# Patient Record
Sex: Male | Born: 1985 | Race: White | Hispanic: No | Marital: Married | Smoking: Never smoker
Health system: Southern US, Community
[De-identification: ages and names within clinical notes are randomized; demographics above are authoritative.]

## PROBLEM LIST (undated history)

## (undated) DIAGNOSIS — B019 Varicella without complication: Secondary | ICD-10-CM

## (undated) HISTORY — DX: Varicella without complication: B01.9

---

## 2016-10-27 ENCOUNTER — Encounter: Payer: Self-pay | Admitting: Physician Assistant

## 2016-10-27 ENCOUNTER — Ambulatory Visit (INDEPENDENT_AMBULATORY_CARE_PROVIDER_SITE_OTHER): Payer: BLUE CROSS/BLUE SHIELD | Admitting: Physician Assistant

## 2016-10-27 VITALS — BP 138/80 | HR 76 | Temp 98.6°F | Resp 16 | Ht 70.0 in | Wt 212.0 lb

## 2016-10-27 DIAGNOSIS — Z0000 Encounter for general adult medical examination without abnormal findings: Secondary | ICD-10-CM | POA: Diagnosis not present

## 2016-10-27 DIAGNOSIS — H60502 Unspecified acute noninfective otitis externa, left ear: Secondary | ICD-10-CM | POA: Diagnosis not present

## 2016-10-27 MED ORDER — CIPROFLOXACIN-DEXAMETHASONE 0.3-0.1 % OT SUSP: 4.0000 [drp] | Freq: Two times a day (BID) | OTIC | 0 refills | Status: AC

## 2016-10-27 NOTE — Patient Instructions (Signed)
Otitis Externa Otitis externa is an infection of the outer ear canal. The outer ear canal is the area between the outside of the ear and the eardrum. Otitis externa is sometimes called "swimmer's ear." Follow these instructions at home:  If you were given antibiotic ear drops, use them as told by your doctor. Do not stop using them even if your condition gets better.  Take over-the-counter and prescription medicines only as told by your doctor.  Keep all follow-up visits as told by your doctor. This is important. How is this prevented?  Keep your ear dry. Use the corner of a towel to dry your ear after you swim or bathe.  Try not to scratch or put things in your ear. Doing these things makes it easier for germs to grow in your ear.  Avoid swimming in lakes, dirty water, or pools that may not have the right amount of a chemical called chlorine.  Consider making ear drops and putting 3 or 4 drops in each ear after you swim. Ask your doctor about how you can make ear drops. Contact a doctor if:  You have a fever.  After 3 days your ear is still red, swollen, or painful.  After 3 days you still have pus coming from your ear.  Your redness, swelling, or pain gets worse.  You have a really bad headache.  You have redness, swelling, pain, or tenderness behind your ear. This information is not intended to replace advice given to you by your health care provider. Make sure you discuss any questions you have with your health care provider. Document Released: 11/25/2007 Document Revised: 07/04/2015 Document Reviewed: 03/18/2015 Elsevier Interactive Patient Education  2017 Elsevier Inc.  

## 2016-10-27 NOTE — Progress Notes (Signed)
Patient: Javier Ferguson Male    DOB: Nov 13, 1985   31 y.o.   MRN: [id number] Visit Date: 10/27/2016  Today's Provider: Trey SailorsAdriana M Pollak, PA-C   Chief Complaint  Patient presents with  . Establish Care  . Ear Pain    Started yesterday   Subjective:    Otalgia   There is pain in the left ear. This is a new problem. The current episode started yesterday. There has been no fever. The pain is at a severity of 0/10 (Pt says his ear feels "clogged" but when he touches it the pain level is 6/10.). Associated symptoms include ear discharge. Pertinent negatives include no headaches, neck pain, rhinorrhea or sore throat.   Javier Ferguson is a 31 y/o male with no contributory PMH presenting today with left sided ear pain ongoing for one pain. There is pain in the canal, some discharge in the morning. There is a muffled sensation in his ear. It hurts to the touch, hurst when he lies down on a pillow. He has been using the blunt end of tweezers to clean his ears out.     No Known Allergies   Current Outpatient Prescriptions:  .  ciprofloxacin-dexamethasone (CIPRODEX) otic suspension, Place 4 drops into the left ear 2 (two) times daily., Disp: 7.5 mL, Rfl: 0  Review of Systems  Constitutional: Positive for fatigue. Negative for activity change, appetite change, chills, diaphoresis, fever and unexpected weight change.  HENT: Positive for ear discharge and ear pain. Negative for congestion, nosebleeds, postnasal drip, rhinorrhea, sinus pain, sinus pressure, sneezing, sore throat, tinnitus, trouble swallowing and voice change.   Eyes: Negative.   Respiratory: Negative.   Gastrointestinal: Negative.   Musculoskeletal: Positive for neck stiffness. Negative for neck pain.  Neurological: Positive for light-headedness. Negative for dizziness and headaches.    Social History  Substance Use Topics  . Smoking status: Never Smoker  . Smokeless tobacco: Never Used  . Alcohol use No   Family  History  Problem Relation Age of Onset  . Healthy Mother   . Healthy Father   . Healthy Sister   . Lung cancer Paternal Grandfather    History reviewed. No pertinent past medical history.   Objective:   BP 138/80 (BP Location: Left Arm, Patient Position: Sitting, Cuff Size: Normal)   Pulse 76   Temp 98.6 F (37 C)   Resp 16   Ht 5\' 10"  (1.778 m)   Wt 212 lb (96.2 kg)   BMI 30.42 kg/m  Vitals:   10/27/16 1608  BP: 138/80  Pulse: 76  Resp: 16  Temp: 98.6 F (37 C)  Weight: 212 lb (96.2 kg)  Height: 5\' 10"  (1.778 m)     Physical Exam  Constitutional: He appears well-developed and well-nourished. No distress.  HENT:  Head: Normocephalic and atraumatic.  Right Ear: Tympanic membrane and external ear normal.  Left Ear: Tympanic membrane normal. There is swelling and tenderness. Tympanic membrane is not injected, not perforated, not erythematous and not bulging.  Mouth/Throat: Oropharynx is clear and moist. No oropharyngeal exudate.  There is minimal left tragal tenderness, some tenderness to scope insertion in left ear canal. Canal is erythematous and swollen but TM is normal.         Assessment & Plan:     1. Encounter for medical examination to establish care  Should follow up in a few months for physical.  2. Acute otitis externa of left ear, unspecified type  Treat as  below, call back if not improving. Please don't use tweezers to clean out ears. Use debrox to soften wax and allow to fall out naturally. Should follow up for physical in a few months.  - ciprofloxacin-dexamethasone (CIPRODEX) otic suspension; Place 4 drops into the left ear 2 (two) times daily.  Dispense: 7.5 mL; Refill: 0  Return if symptoms worsen or fail to improve, for annual physical.  The entirety of the information documented in the History of Present Illness, Review of Systems and Physical Exam were personally obtained by me. Portions of this information were initially documented by Kavin Leech, CMA and reviewed by me for thoroughness and accuracy.        Trey Sailors, PA-C  [facility]

## 2017-08-13 DIAGNOSIS — H5201 Hypermetropia, right eye: Secondary | ICD-10-CM | POA: Diagnosis not present

## 2017-08-13 DIAGNOSIS — H52223 Regular astigmatism, bilateral: Secondary | ICD-10-CM | POA: Diagnosis not present

## 2017-08-14 ENCOUNTER — Emergency Department: Payer: BLUE CROSS/BLUE SHIELD

## 2017-08-14 ENCOUNTER — Emergency Department
Admission: EM | Admit: 2017-08-14 | Discharge: 2017-08-15 | Disposition: A | Discharge status: Home / Self Care | Payer: BLUE CROSS/BLUE SHIELD | Attending: Emergency Medicine | Admitting: Emergency Medicine

## 2017-08-14 ENCOUNTER — Other Ambulatory Visit: Payer: Self-pay

## 2017-08-14 DIAGNOSIS — R509 Fever, unspecified: Secondary | ICD-10-CM | POA: Diagnosis not present

## 2017-08-14 DIAGNOSIS — R42 Dizziness and giddiness: Secondary | ICD-10-CM | POA: Diagnosis not present

## 2017-08-14 DIAGNOSIS — R1011 Right upper quadrant pain: Secondary | ICD-10-CM | POA: Insufficient documentation

## 2017-08-14 DIAGNOSIS — R55 Syncope and collapse: Secondary | ICD-10-CM | POA: Diagnosis not present

## 2017-08-14 DIAGNOSIS — E860 Dehydration: Secondary | ICD-10-CM | POA: Diagnosis not present

## 2017-08-14 DIAGNOSIS — R1110 Vomiting, unspecified: Secondary | ICD-10-CM | POA: Diagnosis not present

## 2017-08-14 DIAGNOSIS — R51 Headache: Secondary | ICD-10-CM | POA: Insufficient documentation

## 2017-08-14 DIAGNOSIS — B349 Viral infection, unspecified: Secondary | ICD-10-CM | POA: Insufficient documentation

## 2017-08-14 LAB — BASIC METABOLIC PANEL
Anion gap: 9 (ref 5–15)
BUN: 13 mg/dL (ref 6–20)
CALCIUM: 9.1 mg/dL (ref 8.9–10.3)
CHLORIDE: 103 mmol/L (ref 101–111)
CO2: 26 mmol/L (ref 22–32)
Creatinine, Ser: 1.01 mg/dL (ref 0.61–1.24)
GFR calc non Af Amer: >60 mL/min (ref >60)
Glucose, Bld: 126 mg/dL — ABNORMAL HIGH (ref 65–99)
Potassium: 3.8 mmol/L (ref 3.5–5.1)
SODIUM: 138 mmol/L (ref 135–145)

## 2017-08-14 LAB — CBC
HCT: 46.9 % (ref 40.0–52.0)
Hemoglobin: 15.6 g/dL (ref 13.0–18.0)
MCH: 27.7 pg (ref 26.0–34.0)
MCHC: 33.2 g/dL (ref 32.0–36.0)
MCV: 83.3 fL (ref 80.0–100.0)
Platelets: 184 10*3/uL (ref 150–440)
RBC: 5.63 MIL/uL (ref 4.40–5.90)
RDW: 14 % (ref 11.5–14.5)
WBC: 9.9 10*3/uL (ref 3.8–10.6)

## 2017-08-14 MED ORDER — SODIUM CHLORIDE 0.9 % IV SOLN
Freq: Once | INTRAVENOUS | Status: AC
  Administered 2017-08-14: via INTRAVENOUS

## 2017-08-14 NOTE — ED Triage Notes (Signed)
Patient states he was in bathroom urinating and felt "faint" next thing he knew he was waking up.  Reports sore throat and tired feeling for most of the day.

## 2017-08-15 ENCOUNTER — Encounter: Payer: Self-pay | Admitting: Emergency Medicine

## 2017-08-15 DIAGNOSIS — R55 Syncope and collapse: Secondary | ICD-10-CM | POA: Diagnosis not present

## 2017-08-15 LAB — MONONUCLEOSIS SCREEN: Mono Screen: NEGATIVE

## 2017-08-15 LAB — INFLUENZA PANEL BY PCR (TYPE A & B)
Influenza A By PCR: NEGATIVE
Influenza B By PCR: NEGATIVE

## 2017-08-15 LAB — URINALYSIS, COMPLETE (UACMP) WITH MICROSCOPIC
Bacteria, UA: NONE SEEN
Bilirubin Urine: NEGATIVE
GLUCOSE, UA: NEGATIVE mg/dL
Hgb urine dipstick: NEGATIVE
Ketones, ur: NEGATIVE mg/dL
Leukocytes, UA: NEGATIVE
Nitrite: NEGATIVE
PH: 8 (ref 5.0–8.0)
Protein, ur: NEGATIVE mg/dL
RBC / HPF: NONE SEEN RBC/hpf (ref 0–5)
SPECIFIC GRAVITY, URINE: 1.02 (ref 1.005–1.030)

## 2017-08-15 LAB — TROPONIN I

## 2017-08-15 LAB — FIBRIN DERIVATIVES D-DIMER (ARMC ONLY): Fibrin derivatives D-dimer (ARMC): 186.36 ng/mL (FEU) (ref 0.00–499.00)

## 2017-08-15 LAB — GROUP A STREP BY PCR: Group A Strep by PCR: NOT DETECTED

## 2017-08-15 MED ORDER — IBUPROFEN 600 MG PO TABS
ORAL_TABLET | ORAL | Status: AC
  Filled 2017-08-15: qty 1

## 2017-08-15 MED ORDER — SODIUM CHLORIDE 0.9 % IV BOLUS (SEPSIS)
1000.0000 mL | Freq: Once | INTRAVENOUS | Status: AC
  Administered 2017-08-15: 1000 mL via INTRAVENOUS

## 2017-08-15 MED ORDER — IBUPROFEN 100 MG/5ML PO SUSP
ORAL | Status: AC
  Filled 2017-08-15: qty 30

## 2017-08-15 MED ORDER — IBUPROFEN 100 MG/5ML PO SUSP
600.0000 mg | Freq: Once | ORAL | Status: AC
  Administered 2017-08-15: 600 mg via ORAL

## 2017-08-15 NOTE — ED Provider Notes (Signed)
[facility] Emergency Department Provider Note   ____________________________________________   First MD Initiated Contact with Patient 08/14/17 2334     (approximate)  I have reviewed the triage vital signs and the nursing notes.   HISTORY  Chief Complaint Loss of Consciousness    HPI Javier Ferguson is a 32 y.o. male who comes into the hospital today with a syncopal episode.  The patient states that he was urinating and felt like he was going to faint.  He reports that he was being woken up by his significant other.  The significant other heard a loud thud.  She states that she found him on the floor.  His eyes were open and he was jerking.  She states that he woke up he was pale clammy and sweaty.  She reports he also had some episodes of vomiting.  The patient has had a sore throat all day and some blurred vision for a few months but he was seen by the eye doctor and was told that he needed some glasses.  Because the patient was not feeling well today he had not really eaten or drank much.  He felt similar when he was in triage here and had another syncopal episode.  EMS was called to the house and the patient's temperature was 100.2.  He states that a coworker has been sick.  He has had some dizziness as well as some mild headache he did have some discomfort in his right upper quadrant that is improved.  The patient significant other also had some flulike symptoms a few weeks ago and was treated with Tamiflu.  He was brought in for evaluation of his symptoms.  Past Medical History:  Diagnosis Date  . Chickenpox     There are no active problems to display for this patient.   History reviewed. No pertinent surgical history.  Prior to Admission medications   Not on File    Allergies Patient has no known allergies.  Family History  Problem Relation Age of Onset  . Healthy Mother   . Healthy Father   . Healthy Sister   . Lung cancer Paternal  Grandfather     Social History Social History   Tobacco Use  . Smoking status: Never Smoker  . Smokeless tobacco: Never Used  Substance Use Topics  . Alcohol use: No  . Drug use: No    Review of Systems  Constitutional: No fever/chills Eyes: No visual changes. ENT:  sore throat. Cardiovascular: Denies chest pain. Respiratory: Denies shortness of breath. Gastrointestinal: abdominal pain.  nausea, vomiting.  No diarrhea.  No constipation. Genitourinary: Negative for dysuria. Musculoskeletal: Negative for back pain. Skin: Negative for rash. Neurological: syncope   ____________________________________________   PHYSICAL EXAM:  VITAL SIGNS: ED Triage Vitals  Enc Vitals Group     BP 08/14/17 2248 131/71     Pulse Rate 08/14/17 2248 (!) 102     Resp 08/14/17 2248 18     Temp 08/14/17 2248 99 F (37.2 C)     Temp Source 08/14/17 2248 Oral     SpO2 08/14/17 2248 99 %     Weight 08/14/17 2247 215 lb (97.5 kg)     Height 08/14/17 2247 5\' 10"  (1.778 m)     Head Circumference --      Peak Flow --      Pain Score 08/14/17 2246 2     Pain Loc --      Pain Edu? --  Excl. in GC? --     Constitutional: Alert and oriented. Well appearing and in mild distress. Eyes: Conjunctivae are normal. PERRL. EOMI. Head: Atraumatic. Nose: No congestion/rhinnorhea. Mouth/Throat: Mucous membranes are moist.  Oropharynx non-erythematous. Cardiovascular: Normal rate, regular rhythm. Grossly normal heart sounds.  Good peripheral circulation. Respiratory: Normal respiratory effort.  No retractions. Lungs CTAB. Gastrointestinal: Soft and nontender. No distention. Positive bowel sounds Musculoskeletal: No lower extremity tenderness nor edema.   Neurologic:  Normal speech and language.  Cranial nerves II through XII are grossly intact with no focal motor neuro deficit Skin:  Skin is warm, dry and intact.  Psychiatric: Mood and affect are normal.    ____________________________________________   LABS (all labs ordered are listed, but only abnormal results are displayed)  Labs Reviewed  BASIC METABOLIC PANEL - Abnormal; Notable for the following components:      Result Value   Glucose, Bld 126 (*)    All other components within normal limits  URINALYSIS, COMPLETE (UACMP) WITH MICROSCOPIC - Abnormal; Notable for the following components:   Color, Urine YELLOW (*)    APPearance CLEAR (*)    Squamous Epithelial / LPF 0-5 (*)    All other components within normal limits  GROUP A STREP BY PCR  CBC  TROPONIN I  INFLUENZA PANEL BY PCR (TYPE A & B)  MONONUCLEOSIS SCREEN  FIBRIN DERIVATIVES D-DIMER ([facility] ONLY)   ____________________________________________  EKG  ED ECG REPORT I, Rebecka ApleyWebster,  Jeorgia Helming P, the attending physician, personally viewed and interpreted this ECG.   Date: 08/14/2017  EKG Time: 2247  Rate: 111  Rhythm: normal sinus rhythm, sinus tachycardia  Axis: normal  Intervals:none  ST&T Change:   ____________________________________________  RADIOLOGY  ED MD interpretation:  CT head: No acute intracranial abnormality  Official radiology report(s): Ct Head Wo Contrast  Result Date: 08/15/2017 CLINICAL DATA:  Syncopal episode in bathroom. Sore throat and malaise today. EXAM: CT HEAD WITHOUT CONTRAST TECHNIQUE: Contiguous axial images were obtained from the base of the skull through the vertex without intravenous contrast. COMPARISON:  None. FINDINGS: BRAIN: No intraparenchymal hemorrhage, mass effect nor midline shift. The ventricles and sulci are normal. No acute large vascular territory infarcts. No abnormal extra-axial fluid collections. Basal cisterns are patent. VASCULAR: Unremarkable. SKULL/SOFT TISSUES: No skull fracture. No significant soft tissue swelling. ORBITS/SINUSES: The included ocular globes and orbital contents are normal.The mastoid aircells and included paranasal sinuses are well-aerated. OTHER:  None. IMPRESSION: Normal noncontrast CT HEAD. Electronically Signed   By: Awilda Metroourtnay  Bloomer M.D.   On: 08/15/2017 00:09    ____________________________________________   PROCEDURES  Procedure(s) performed: None  Procedures  Critical Care performed: No  ____________________________________________   INITIAL IMPRESSION / ASSESSMENT AND PLAN / ED COURSE  As part of my medical decision making, I reviewed the following data within the electronic MEDICAL RECORD NUMBER Notes from prior ED visits and [place] Controlled Substance Database   This is a 32 year old male who comes into the hospital today with a syncopal episode.  The patient has not felt well today and states that he was urinating when this occurred.  My differential diagnosis includes micturition syncope, influenza, mononucleosis, strep, dehydration.  I did check some blood work on the patient.  The patient CBC BMP and troponin were unremarkable.  His flu and his mono were negative.  I did look at the patient's EKG and he does have S in his first lead as well as a Q wave in his third lead with some flattening of  his T wave.  The patient is little tachycardic so I will add a d-dimer onto his blood work and give him a second liter of normal saline.  The patient will be reassessed once I received his results.  The strep and the urinalysis was still pending at this time.     The strep is negative and is the mono, the patient's remaining blood work is all unremarkable.  He also had a d-dimer which was negative.  The patient was tachycardic after his first liter so I gave him a second and then subsequently a third.  The patient also received a dose of ibuprofen for headache and he was given some food.  Since the patient's heart rate is improved he will be discharged home to follow-up. ____________________________________________   FINAL CLINICAL IMPRESSION(S) / ED DIAGNOSES  Final diagnoses:  Syncope, unspecified syncope type  Dehydration   Viral illness     ED Discharge Orders    None       Note:  This document was prepared using Dragon voice recognition software and may include unintentional dictation errors.    Rebecka Apley, MD 08/15/17 0530

## 2017-08-15 NOTE — Discharge Instructions (Signed)
Please follow up with your primary care physician for further evaluation of your symptoms. Please ensure that you are eating and drinking well. Please return with any other concerns

## 2017-08-16 ENCOUNTER — Encounter: Payer: Self-pay | Admitting: Physician Assistant

## 2017-08-16 ENCOUNTER — Ambulatory Visit: Payer: BLUE CROSS/BLUE SHIELD | Admitting: Physician Assistant

## 2017-08-16 VITALS — BP 138/82 | HR 98 | Temp 98.8°F | Resp 16 | Wt 218.4 lb

## 2017-08-16 DIAGNOSIS — R7301 Impaired fasting glucose: Secondary | ICD-10-CM

## 2017-08-16 DIAGNOSIS — R55 Syncope and collapse: Secondary | ICD-10-CM

## 2017-08-16 DIAGNOSIS — E860 Dehydration: Secondary | ICD-10-CM

## 2017-08-16 LAB — POCT GLYCOSYLATED HEMOGLOBIN (HGB A1C)
Est. average glucose Bld gHb Est-mCnc: 105
Hemoglobin A1C: 5.3

## 2017-08-16 NOTE — Progress Notes (Signed)
Patient: Javier Ferguson Male    DOB: 03-Apr-1986   32 y.o.   MRN: [id number] Visit Date: 08/16/2017  Today's Provider: Margaretann LovelessJennifer M Burnette, PA-C   Chief Complaint  Patient presents with  . Follow-up   Subjective:    HPI   Follow Up ER Visit  Patient is here for ER follow up.  [facility] Syncope on 08/14/17. Treatment for this included Fluids.The patient CBC, BMP and troponin were unremarkable.  His flu, strep and his mono were negative. Patient's EKG -Sinus tachycardia He reports excellent compliance with treatment. He also had a d-dimer which was negative. He reports this condition is Improved. Reports that feels tired. He still has a sore throat. Family history of Hypertension, Diabetes Mellitus and Alcoholism. ------------------------------------------------------------------------------------    No Known Allergies  No current outpatient medications on file.  Review of Systems  Constitutional: Positive for fatigue.  HENT: Positive for congestion, postnasal drip and sore throat (improving). Negative for ear pain, nosebleeds, rhinorrhea, sinus pressure, sinus pain, sneezing, tinnitus and trouble swallowing.   Respiratory: Negative for cough, chest tightness, shortness of breath and wheezing.   Cardiovascular: Negative for chest pain, palpitations and leg swelling.  Gastrointestinal: Negative for abdominal pain.  Neurological: Negative for dizziness, syncope (none since Saturday incidents), weakness, light-headedness and headaches.    Social History   Tobacco Use  . Smoking status: Never Smoker  . Smokeless tobacco: Never Used  Substance Use Topics  . Alcohol use: No   Objective:   BP 138/82 (BP Location: Left Arm, Patient Position: Sitting, Cuff Size: Normal)   Pulse 98   Temp 98.8 F (37.1 C) (Oral)   Resp 16   Wt 218 lb 6.4 oz (99.1 kg)   SpO2 97%   BMI 31.34 kg/m    Physical Exam  Constitutional: He appears well-developed  and well-nourished. No distress.  HENT:  Head: Normocephalic and atraumatic.  Right Ear: Hearing, external ear and ear canal normal. Tympanic membrane is not erythematous and not bulging. A middle ear effusion is present.  Left Ear: Hearing, tympanic membrane, external ear and ear canal normal. Tympanic membrane is not erythematous and not bulging.  No middle ear effusion.  Nose: Nose normal. No mucosal edema or rhinorrhea. Right sinus exhibits no maxillary sinus tenderness and no frontal sinus tenderness. Left sinus exhibits no maxillary sinus tenderness and no frontal sinus tenderness.  Mouth/Throat: Uvula is midline, oropharynx is clear and moist and mucous membranes are normal. No oropharyngeal exudate, posterior oropharyngeal edema or posterior oropharyngeal erythema.  Eyes: Conjunctivae and EOM are normal. Pupils are equal, round, and reactive to light. Right eye exhibits no discharge. Left eye exhibits no discharge.  Neck: Normal range of motion. Neck supple. No tracheal deviation present. No Brudzinski's sign and no Kernig's sign noted. No thyromegaly present.  Cardiovascular: Normal rate, regular rhythm and normal heart sounds. Exam reveals no gallop and no friction rub.  No murmur heard. Pulmonary/Chest: Effort normal and breath sounds normal. No stridor. No respiratory distress. He has no wheezes. He has no rales.  Lymphadenopathy:    He has no cervical adenopathy.  Skin: Skin is warm and dry. He is not diaphoretic.  Vitals reviewed.       Assessment & Plan:     1. Dehydration Improving. Continue to push fluids. Advised to monitor BP at home.   2. Syncope, unspecified syncope type No other occurrence since Saturday after getting 3 L of NS. Suspected  secondary to dehydration. Continue to push oral fluids.   3. Elevated fasting glucose Fasting sugar in ER was elevated at 126. Patient does have FHx of T2DM. A1c checked today and normal at 5.3. - POCT HgB A1C       Javier Loveless, PA-C  [facility]

## 2018-03-29 ENCOUNTER — Ambulatory Visit: Payer: BLUE CROSS/BLUE SHIELD | Admitting: Physician Assistant

## 2018-03-29 ENCOUNTER — Encounter: Payer: Self-pay | Admitting: Physician Assistant

## 2018-03-29 VITALS — BP 130/88 | HR 78 | Temp 98.2°F | Resp 16 | Wt 230.0 lb

## 2018-03-29 DIAGNOSIS — H6091 Unspecified otitis externa, right ear: Secondary | ICD-10-CM

## 2018-03-29 MED ORDER — NEOMYCIN-POLYMYXIN-HC 3.5-10000-1 OT SOLN: 4.0000 [drp] | Freq: Four times a day (QID) | OTIC | 0 refills | Status: AC

## 2018-03-29 NOTE — Progress Notes (Signed)
       Patient: Javier Ferguson Male    DOB: 1986-02-22   32 y.o.   MRN: [id number] Visit Date: 03/29/2018  Today's Provider: Trey Sailors, PA-C   Chief Complaint  Patient presents with  . Ear Pain   Subjective:    HPI   Patient here today c/o right ear pain x's 2 days. Denies fevers, chills, ear discharge.     No Known Allergies  No current outpatient medications on file.  Review of Systems  Constitutional: Negative.   HENT: Positive for ear pain.   Respiratory: Negative.   Cardiovascular: Negative.     Social History   Tobacco Use  . Smoking status: Never Smoker  . Smokeless tobacco: Never Used  Substance Use Topics  . Alcohol use: No   Objective:   BP 130/88 (BP Location: Right Arm, Patient Position: Sitting, Cuff Size: Large)   Pulse 78   Temp 98.2 F (36.8 C) (Oral)   Resp 16   Wt 230 lb (104.3 kg)   SpO2 97%   BMI 33.00 kg/m  Vitals:   03/29/18 1209  BP: 130/88  Pulse: 78  Resp: 16  Temp: 98.2 F (36.8 C)  TempSrc: Oral  SpO2: 97%  Weight: 230 lb (104.3 kg)     Physical Exam  Constitutional: He appears well-developed and well-nourished.  HENT:  Right Ear: Tympanic membrane and external ear normal.  Left Ear: Tympanic membrane, external ear and ear canal normal.  Mouth/Throat: Oropharynx is clear and moist. No oropharyngeal exudate.  Right tragus tender to palpation. Right canal erythematous and swollen.   Neck: Neck supple.  Lymphadenopathy:    He has no cervical adenopathy.  Psychiatric: He has a normal mood and affect. His behavior is normal.        Assessment & Plan:     1. Otitis externa of right ear, unspecified chronicity, unspecified type  - neomycin-polymyxin-hydrocortisone (CORTISPORIN) OTIC solution; Place 4 drops into the right ear 4 (four) times daily.  Dispense: 10 mL; Refill: 0  Return in about 6 months (around 09/28/2018) for CPE.  The entirety of the information documented in the History of Present Illness,  Review of Systems and Physical Exam were personally obtained by me. Portions of this information were initially documented by Rondel Baton, CMA and reviewed by me for thoroughness and accuracy.        Trey Sailors, PA-C  [facility]

## 2018-03-29 NOTE — Patient Instructions (Signed)
Otitis Externa Otitis externa is an infection of the outer ear canal. The outer ear canal is the area between the outside of the ear and the eardrum. Otitis externa is sometimes called "swimmer's ear." Follow these instructions at home:  If you were given antibiotic ear drops, use them as told by your doctor. Do not stop using them even if your condition gets better.  Take over-the-counter and prescription medicines only as told by your doctor.  Keep all follow-up visits as told by your doctor. This is important. How is this prevented?  Keep your ear dry. Use the corner of a towel to dry your ear after you swim or bathe.  Try not to scratch or put things in your ear. Doing these things makes it easier for germs to grow in your ear.  Avoid swimming in lakes, dirty water, or pools that may not have the right amount of a chemical called chlorine.  Consider making ear drops and putting 3 or 4 drops in each ear after you swim. Ask your doctor about how you can make ear drops. Contact a doctor if:  You have a fever.  After 3 days your ear is still red, swollen, or painful.  After 3 days you still have pus coming from your ear.  Your redness, swelling, or pain gets worse.  You have a really bad headache.  You have redness, swelling, pain, or tenderness behind your ear. This information is not intended to replace advice given to you by your health care provider. Make sure you discuss any questions you have with your health care provider. Document Released: 11/25/2007 Document Revised: 07/04/2015 Document Reviewed: 03/18/2015 Elsevier Interactive Patient Education  2018 Elsevier Inc.  

## 2018-08-26 ENCOUNTER — Telehealth: Payer: Self-pay

## 2018-08-26 ENCOUNTER — Ambulatory Visit (INDEPENDENT_AMBULATORY_CARE_PROVIDER_SITE_OTHER): Payer: BLUE CROSS/BLUE SHIELD | Admitting: Physician Assistant

## 2018-08-26 ENCOUNTER — Encounter: Payer: Self-pay | Admitting: Physician Assistant

## 2018-08-26 VITALS — BP 150/92 | HR 97 | Temp 98.9°F | Ht 70.0 in | Wt 237.0 lb

## 2018-08-26 DIAGNOSIS — Z0000 Encounter for general adult medical examination without abnormal findings: Secondary | ICD-10-CM | POA: Diagnosis not present

## 2018-08-26 DIAGNOSIS — Z23 Encounter for immunization: Secondary | ICD-10-CM | POA: Diagnosis not present

## 2018-08-26 DIAGNOSIS — Z1329 Encounter for screening for other suspected endocrine disorder: Secondary | ICD-10-CM

## 2018-08-26 DIAGNOSIS — Z130 Encounter for screening for diseases of the blood and blood-forming organs and certain disorders involving the immune mechanism: Secondary | ICD-10-CM

## 2018-08-26 DIAGNOSIS — N529 Male erectile dysfunction, unspecified: Secondary | ICD-10-CM

## 2018-08-26 DIAGNOSIS — Z114 Encounter for screening for human immunodeficiency virus [HIV]: Secondary | ICD-10-CM

## 2018-08-26 DIAGNOSIS — Z131 Encounter for screening for diabetes mellitus: Secondary | ICD-10-CM | POA: Diagnosis not present

## 2018-08-26 DIAGNOSIS — R6882 Decreased libido: Secondary | ICD-10-CM

## 2018-08-26 DIAGNOSIS — Z13220 Encounter for screening for lipoid disorders: Secondary | ICD-10-CM

## 2018-08-26 NOTE — Patient Instructions (Signed)

## 2018-08-26 NOTE — Telephone Encounter (Signed)
I can't add the testosterone onto the current lab draw because it needs to be drawn between 8-10 AM in the morning. I will add in a separate lab and he will need to return to get it drawn between 8-10 AM. If it is low, it will require a second morning lab draw in the morning.

## 2018-08-26 NOTE — Telephone Encounter (Signed)
Patients wife called the office with concerns. Wife states that patient was seen for physical today but was embarrassed to bring up certain issues. Wife states that patient has been complaining of fatigue and decreased libido for the past few months. Wife states that patient is unable to maintain erection and has concerns of ED. Wife is requesting that you add on to his labs to screen his testosterone levels. Wife states that you can call her or her spouse back with further questions. KW

## 2018-08-26 NOTE — Progress Notes (Signed)
Patient: Javier Ferguson, Male    DOB: 12-28-1985, 33 y.o.   MRN: [id number] Visit Date: 09/01/2018  Today's Provider: Trinna Post, PA-C   Chief Complaint  Patient presents with  . Annual Exam   Subjective:    Annual physical exam Javier Ferguson is a 33 y.o. male who presents today for health maintenance and complete physical. He feels well. He reports exercising little still. He reports he is sleeping well.  BP Readings from Last 3 Encounters:  08/26/18 (!) 150/92  03/29/18 130/88  08/16/17 138/82   Obesity  Wt Readings from Last 3 Encounters:  08/26/18 237 lb (107.5 kg)  03/29/18 230 lb (104.3 kg)  08/16/17 218 lb 6.4 oz (99.1 kg)    Patient reports that he eats "nothing good" in regards to his diet.  Can of sprite daily, chocolate milk in the morning which he refers to as his coffee, half a [employer] pizza for lunch. Frequently eats subs, eat out twice a week. Snacks include cookies, chips, and crackers. Reports he has a certain issue with texture and this stops him form eating a lot of foods.   Patient does not report this to me but after conclusion of visit patient's wife calls the clinic and reports that patient is having fatigue, decreased libido and inability to maintain an erection. Wife is requesting patient's testosterone be checked.  -----------------------------------------------------------------   Review of Systems  Constitutional: Positive for fatigue.  HENT: Negative.   Eyes: Negative.   Respiratory: Negative.   Cardiovascular: Negative.   Gastrointestinal: Negative.   Endocrine: Negative.   Genitourinary: Negative.   Musculoskeletal: Positive for neck stiffness.  Skin: Negative.   Allergic/Immunologic: Negative.   Neurological: Negative.   Hematological: Negative.   Psychiatric/Behavioral: Negative.     Social History He  reports that he has never smoked. He has never used smokeless tobacco. He reports that he does not drink  alcohol or use drugs. Social History   Socioeconomic History  . Marital status: Married    Spouse name: Not on file  . Number of children: Not on file  . Years of education: 12  Grade  . Highest education level: Not on file  Occupational History  . Not on file  Social Needs  . Financial resource strain: Not on file  . Food insecurity:    Worry: Not on file    Inability: Not on file  . Transportation needs:    Medical: Not on file    Non-medical: Not on file  Tobacco Use  . Smoking status: Never Smoker  . Smokeless tobacco: Never Used  Substance and Sexual Activity  . Alcohol use: No  . Drug use: No  . Sexual activity: Not on file  Lifestyle  . Physical activity:    Days per week: Not on file    Minutes per session: Not on file  . Stress: Not on file  Relationships  . Social connections:    Talks on phone: Not on file    Gets together: Not on file    Attends religious service: Not on file    Active member of club or organization: Not on file    Attends meetings of clubs or organizations: Not on file    Relationship status: Not on file  Other Topics Concern  . Not on file  Social History Narrative  . Not on file    There are no active problems to display for this patient.   No past  surgical history on file.  Family History  Family Status  Relation Name Status  . Mother  Alive  . Father  Alive  . Sister  Alive  . PGF  Deceased   His family history includes Healthy in his father, mother, and sister; Lung cancer in his paternal grandfather.     No Known Allergies  Previous Medications   NEOMYCIN-POLYMYXIN-HYDROCORTISONE (CORTISPORIN) OTIC SOLUTION    Place 4 drops into the right ear 4 (four) times daily.    Patient Care Team: Paulene Floor as PCP - General (Physician Assistant)      Objective:   Vitals: BP (!) 150/92 (BP Location: Left Arm, Patient Position: Sitting, Cuff Size: Normal)   Pulse 97   Temp 98.9 F (37.2 C) (Oral)   Ht 5'  10" (1.778 m)   Wt 237 lb (107.5 kg)   SpO2 97%   BMI 34.01 kg/m    Physical Exam Constitutional:      Appearance: Normal appearance. He is normal weight.  HENT:     Right Ear: Tympanic membrane and ear canal normal.     Left Ear: Tympanic membrane and ear canal normal.     Mouth/Throat:     Mouth: Mucous membranes are moist.     Pharynx: Oropharynx is clear.  Cardiovascular:     Rate and Rhythm: Normal rate and regular rhythm.     Pulses: Normal pulses.     Heart sounds: Normal heart sounds.  Pulmonary:     Effort: Pulmonary effort is normal.     Breath sounds: Normal breath sounds.  Abdominal:     General: Abdomen is flat. Bowel sounds are normal.     Palpations: Abdomen is soft.  Skin:    General: Skin is warm and dry.  Neurological:     Mental Status: He is oriented to person, place, and time. Mental status is at baseline.  Psychiatric:        Mood and Affect: Mood normal.        Behavior: Behavior normal.      Depression Screen PHQ 2/9 Scores 08/26/2018 10/28/2016  PHQ - 2 Score 3 0  PHQ- 9 Score 5 2      Assessment & Plan:     Routine Health Maintenance and Physical Exam  Exercise Activities and Dietary recommendations Goals   None     Immunization History  Administered Date(s) Administered  . Hepatitis B 04/23/1998, 05/23/1998  . IPV 04/23/1998  . MMR 04/23/1998  . Td 04/23/1998  . Tdap 08/26/2018    Health Maintenance  Topic Date Due  . INFLUENZA VACCINE  09/20/2018 (Originally 01/20/2018)  . TETANUS/TDAP  08/25/2028  . HIV Screening  Completed     Discussed health benefits of physical activity, and encouraged him to engage in regular exercise appropriate for his age and condition.    1. Annual physical exam  Counseled on diet, his diet is high in sugar and fat and low in fiber. Counseled that he does need to eat a more balanced diet with increased fruits, vegetables and whole foods and less processed foods and sugar.   2. Encounter for  screening for HIV  - HIV antibody (with reflex)  3. Need for tetanus, diphtheria, and acellular pertussis (Tdap) vaccine  - Tdap vaccine greater than or equal to 7yo IM  4. Screening for thyroid disorder  - TSH  5. Screening cholesterol level  - Lipid panel  6. Diabetes mellitus screening  - Comprehensive metabolic panel  7. Screening for deficiency anemia  - CBC with Differential/Platelet  8. Erectile dysfunction, unspecified erectile dysfunction type   Added testosterone. He will have to come back between 8-10.   The entirety of the information documented in the History of Present Illness, Review of Systems and Physical Exam were personally obtained by me. Portions of this information were initially documented by Joseline Rosas, CMA and reviewed by me for thoroughness and accuracy.   Return in about 1 year (around 08/26/2019) for CPE.   --------------------------------------------------------------------  

## 2018-08-27 LAB — CBC WITH DIFFERENTIAL/PLATELET
Basophils Absolute: 0 10*3/uL (ref 0.0–0.2)
Basos: 0 %
EOS (ABSOLUTE): 0.1 10*3/uL (ref 0.0–0.4)
Eos: 1 %
Hematocrit: 41.1 % (ref 37.5–51.0)
Hemoglobin: 14.1 g/dL (ref 13.0–17.7)
Immature Grans (Abs): 0 10*3/uL (ref 0.0–0.1)
Immature Granulocytes: 0 %
Lymphocytes Absolute: 1.9 10*3/uL (ref 0.7–3.1)
Lymphs: 28 %
MCH: 27.5 pg (ref 26.6–33.0)
MCHC: 34.3 g/dL (ref 31.5–35.7)
MCV: 80 fL (ref 79–97)
Monocytes Absolute: 0.6 10*3/uL (ref 0.1–0.9)
Monocytes: 9 %
Neutrophils Absolute: 4.2 10*3/uL (ref 1.4–7.0)
Neutrophils: 62 %
Platelets: 219 10*3/uL (ref 150–450)
RBC: 5.12 x10E6/uL (ref 4.14–5.80)
RDW: 13.4 % (ref 11.6–15.4)
WBC: 6.7 10*3/uL (ref 3.4–10.8)

## 2018-08-27 LAB — LIPID PANEL
Chol/HDL Ratio: 4.6 ratio (ref 0.0–5.0)
Cholesterol, Total: 183 mg/dL (ref 100–199)
HDL: 40 mg/dL (ref >39)
LDL Calculated: 100 mg/dL — ABNORMAL HIGH (ref 0–99)
Triglycerides: 215 mg/dL — ABNORMAL HIGH (ref 0–149)
VLDL Cholesterol Cal: 43 mg/dL — ABNORMAL HIGH (ref 5–40)

## 2018-08-27 LAB — HIV ANTIBODY (ROUTINE TESTING W REFLEX): HIV Screen 4th Generation wRfx: NONREACTIVE

## 2018-08-27 LAB — COMPREHENSIVE METABOLIC PANEL
ALT: 27 IU/L (ref 0–44)
AST: 24 IU/L (ref 0–40)
Albumin/Globulin Ratio: 1.9 (ref 1.2–2.2)
Albumin: 4.7 g/dL (ref 4.0–5.0)
Alkaline Phosphatase: 101 IU/L (ref 39–117)
BUN/Creatinine Ratio: 15 (ref 9–20)
BUN: 15 mg/dL (ref 6–20)
Bilirubin Total: <0.2 mg/dL (ref 0.0–1.2)
CO2: 19 mmol/L — ABNORMAL LOW (ref 20–29)
Calcium: 9.7 mg/dL (ref 8.7–10.2)
Chloride: 101 mmol/L (ref 96–106)
Creatinine, Ser: 1.01 mg/dL (ref 0.76–1.27)
GFR calc Af Amer: 112 mL/min/{1.73_m2} (ref >59)
GFR calc non Af Amer: 97 mL/min/{1.73_m2} (ref >59)
Globulin, Total: 2.5 g/dL (ref 1.5–4.5)
Glucose: 106 mg/dL — ABNORMAL HIGH (ref 65–99)
Potassium: 4 mmol/L (ref 3.5–5.2)
Sodium: 141 mmol/L (ref 134–144)
Total Protein: 7.2 g/dL (ref 6.0–8.5)

## 2018-08-27 LAB — TSH: TSH: 2.34 u[IU]/mL (ref 0.450–4.500)

## 2018-08-29 NOTE — Telephone Encounter (Signed)
Please read below on lab work.

## 2018-08-29 NOTE — Telephone Encounter (Signed)
Patient was advised and states that he will be in Friday, 09/02/2018 morning to have labs redrawn.

## 2018-08-30 ENCOUNTER — Telehealth: Payer: Self-pay

## 2018-08-30 DIAGNOSIS — R739 Hyperglycemia, unspecified: Secondary | ICD-10-CM

## 2018-08-30 NOTE — Telephone Encounter (Signed)
A1C lab for ordered and patient was advised to come in  Between 8 am-10 am.

## 2018-08-30 NOTE — Telephone Encounter (Signed)
-----   Message from Trey Sailors, [place] sent at 08/29/2018  3:11 PM EDT ----- Can we please add A1C to sample. Please add under dx hyperglycemia. Sugar slightly high. I did order the testosterone, he will have to come get it drawn between 8-10 in the morning.

## 2018-09-02 ENCOUNTER — Telehealth: Payer: Self-pay | Admitting: Physician Assistant

## 2018-09-02 NOTE — Telephone Encounter (Signed)
Patient is returning call to [facility]

## 2018-09-03 NOTE — Telephone Encounter (Signed)
Spoke with Alvina Chou patient's wife who is on the [place].

## 2019-03-04 ENCOUNTER — Ambulatory Visit
Admission: EM | Admit: 2019-03-04 | Discharge: 2019-03-04 | Disposition: A | Discharge status: Home / Self Care | Payer: BC Managed Care – PPO | Attending: Emergency Medicine | Admitting: Emergency Medicine

## 2019-03-04 ENCOUNTER — Other Ambulatory Visit: Payer: Self-pay

## 2019-03-04 ENCOUNTER — Encounter: Payer: Self-pay | Admitting: Emergency Medicine

## 2019-03-04 DIAGNOSIS — R05 Cough: Secondary | ICD-10-CM | POA: Diagnosis not present

## 2019-03-04 DIAGNOSIS — R6883 Chills (without fever): Secondary | ICD-10-CM | POA: Diagnosis not present

## 2019-03-04 DIAGNOSIS — J029 Acute pharyngitis, unspecified: Secondary | ICD-10-CM

## 2019-03-04 DIAGNOSIS — Z7189 Other specified counseling: Secondary | ICD-10-CM

## 2019-03-04 DIAGNOSIS — J069 Acute upper respiratory infection, unspecified: Secondary | ICD-10-CM | POA: Diagnosis not present

## 2019-03-04 DIAGNOSIS — R067 Sneezing: Secondary | ICD-10-CM

## 2019-03-04 LAB — RAPID STREP SCREEN (MED CTR MEBANE ONLY): Streptococcus, Group A Screen (Direct): NEGATIVE

## 2019-03-04 NOTE — ED Triage Notes (Signed)
Patient c/o sore throat and cough that started on Wed.  Patient unsure of fever.

## 2019-03-04 NOTE — Discharge Instructions (Addendum)
You were seen today for viral symptoms and were tested for the COVID-19. You are to stay quarantined until your test result return.   Treat your symptoms with over the counter medications, rest and stay hydrated.  If you get worse, go to the ER.

## 2019-03-05 NOTE — ED Provider Notes (Signed)
[facility] - [place], [place]   Name: Javier Ferguson DOB: 10/24/1985 MRN: [id number] CSN: [id number] PCP: Trinna Post, PA-C  Arrival date and time:  03/04/19 1440  Chief Complaint:  Sore Throat and Cough   NOTE: Prior to seeing the patient today, I have reviewed the triage nursing documentation and vital signs. Clinical staff has updated patient's PMH/PSHx, current medication list, and drug allergies/intolerances to ensure comprehensive history available to assist in medical decision making.   History:   HPI: Javier Ferguson is a 33 y.o. male who presents today with complaints of sore throat and cough for 3 days. He also present with body aches and some night sweats. He noticed these symptoms upon awakening Wednesday and tried OTC cold/cough meds and tylenol to alleviate. The medications helped with the body aches and sore throat, but the general malaise has continues. He denies any exposure to anyone with known of suspected COVID-19, but he is still working out of his home. He presents today with his wife who is exuviating similar symptoms. No known/recorded fever, nausea, vomiting, or diarrhea.    Past Medical History:  Diagnosis Date  . Chickenpox     History reviewed. No pertinent surgical history.  Family History  Problem Relation Age of Onset  . Healthy Mother   . Healthy Father   . Healthy Sister   . Lung cancer Paternal Grandfather     Social History   Tobacco Use  . Smoking status: Never Smoker  . Smokeless tobacco: Never Used  Substance Use Topics  . Alcohol use: No  . Drug use: No    There are no active problems to display for this patient.   Home Medications:    No outpatient medications have been marked as taking for the 03/04/19 encounter [facility]).    Allergies:   Patient has no known allergies.  Review of Systems (ROS): Review of Systems  Constitutional: Positive for chills and fatigue. Negative for fever.  HENT:  Positive for sneezing and sore throat. Negative for congestion, postnasal drip and sinus pressure.      Vital Signs: Today's Vitals   03/04/19 1455 03/04/19 1457 03/04/19 1559  BP:  128/82   Pulse:  (!) 102   Resp:  16   Temp:  98.9 F (37.2 C)   TempSrc:  Oral   SpO2:  100%   Weight: 238 lb (108 kg)    Height: 5\' 10"  (1.778 m)    PainSc: 0-No pain  0-No pain    Physical Exam: Physical Exam Vitals signs and nursing note reviewed.  Constitutional:      Appearance: He is well-developed.  HENT:     Head: Normocephalic and atraumatic.     Right Ear: Tympanic membrane normal.     Left Ear: Tympanic membrane normal.     Nose: No congestion.     Mouth/Throat:     Pharynx: Posterior oropharyngeal erythema present. No oropharyngeal exudate.     Tonsils: No tonsillar exudate.  Eyes:     Conjunctiva/sclera: Conjunctivae normal.  Neck:     Musculoskeletal: Neck supple.  Cardiovascular:     Rate and Rhythm: Normal rate and regular rhythm.     Heart sounds: No murmur.  Pulmonary:     Effort: Pulmonary effort is normal. No respiratory distress.     Breath sounds: Normal breath sounds.  Abdominal:     Palpations: Abdomen is soft.     Tenderness: There is no abdominal tenderness.  Skin:  General: Skin is warm and dry.  Neurological:     Mental Status: He is alert and oriented to person, place, and time.     Urgent Care Treatments / Results:   LABS: PLEASE NOTE: all labs that were ordered this encounter are listed, however only abnormal results are displayed. Labs Reviewed  RAPID STREP SCREEN ([facility] ONLY)  NOVEL CORONAVIRUS, NAA (HOSP ORDER, SEND-OUT TO REF LAB; TAT 18-24 HRS)  CULTURE, GROUP A STREP [facility])    EKG: -None  RADIOLOGY: No results found.  PROCEDURES: Procedures  MEDICATIONS RECEIVED THIS VISIT: Medications - No data to display  PERTINENT CLINICAL COURSE NOTES/UPDATES:   Initial Impression / Assessment and Plan / Urgent Care Course:   Pertinent labs & imaging results that were available during my care of the patient were personally reviewed by me and considered in my medical decision making (see lab/imaging section of note for values and interpretations).  Javier Ferguson is a 33 y.o. male who [facility] today with complaints of viral symtoms, diagnosed with a viral URI, and treated as such with the procedure above and home care instruction below. NP and patient reviewed discharge instructions below during visit.   Patient is well appearing overall in clinic today. He does not appear to be in any acute distress. Presenting symptoms (see HPI) and exam as documented above.   I have reviewed the follow up and strict return precautions for any new or worsening symptoms. Patient is aware of symptoms that would be deemed urgent/emergent, and would thus require further evaluation either here or in the emergency department. At the time of discharge, he verbalized understanding and consent with the discharge plan as it was reviewed with him. All questions were fielded by provider and/or clinic staff prior to patient discharge.    Final Clinical Impressions / Urgent Care Diagnoses:   Final diagnoses:  Viral URI  Advice Given About 2019 Novel Coronavirus Infection    New Prescriptions:  [place]rolled Substance Registry consulted? Not Applicable  No orders of the defined types were placed in this encounter.     Discharge Instructions     You were seen today for viral symptoms and were tested for the COVID-19. You are to stay quarantined until your test result return.   Treat your symptoms with over the counter medications, rest and stay hydrated.  If you get worse, go to the ER.      Recommended Follow up Care:  Patient encouraged to follow up with the following provider within the specified time frame, or sooner as dictated by the severity of his symptoms. As always, he was instructed that for any  urgent/emergent care needs, he should seek care either here or in the emergency department for more immediate evaluation.   Javier MechLunise Javier Haq, DNP, NP-c    Javier MechBenjamin, Naphtali Zywicki, NP 03/05/19 1112

## 2019-03-06 ENCOUNTER — Telehealth: Payer: Self-pay | Admitting: Emergency Medicine

## 2019-03-06 LAB — NOVEL CORONAVIRUS, NAA (HOSP ORDER, SEND-OUT TO REF LAB; TAT 18-24 HRS): SARS-CoV-2, NAA: NOT DETECTED

## 2019-03-06 NOTE — Telephone Encounter (Signed)
Pt wife called about COVID results. Advised of Negative results.

## 2019-03-07 LAB — CULTURE, GROUP A STREP (THRC)

## 2019-03-28 ENCOUNTER — Ambulatory Visit (INDEPENDENT_AMBULATORY_CARE_PROVIDER_SITE_OTHER): Payer: BC Managed Care – PPO | Admitting: Physician Assistant

## 2019-03-28 DIAGNOSIS — R0981 Nasal congestion: Secondary | ICD-10-CM | POA: Diagnosis not present

## 2019-03-28 DIAGNOSIS — J309 Allergic rhinitis, unspecified: Secondary | ICD-10-CM | POA: Diagnosis not present

## 2019-03-28 MED ORDER — AMOXICILLIN-POT CLAVULANATE 875-125 MG PO TABS: 1.0000 | ORAL_TABLET | Freq: Two times a day (BID) | ORAL | 0 refills | Status: AC

## 2019-03-28 NOTE — Progress Notes (Signed)
       Patient: Javier Ferguson Male    DOB: Jul 29, 1985   33 y.o.   MRN: [id number] Visit Date: 03/28/2019  Today's Provider: Trinna Post, PA-C   Chief Complaint  Patient presents with  . URI   Subjective:    Virtual Visit via Telephone Note  I connected with Javier Ferguson on 03/28/19 at 10:20 AM EDT by telephone and verified that I am speaking with the correct person using two identifiers.  Location: Patient: Home Provider: Office   I discussed the limitations, risks, security and privacy concerns of performing an evaluation and management service by telephone and the availability of in person appointments. I also discussed with the patient that there may be a patient responsible charge related to this service. The patient expressed understanding and agreed to proceed.   HPI Upper Respiratory Infection: Patient complains of symptoms of a URI, possible sinusitis. Symptoms include congestion and cough. Onset of symptoms was 1 month ago, gradually improving since that time. He also c/o nasal congestion for the past 1 month .  He is drinking plenty of fluids. Evaluation to date: none. Treatment to date: cough suppressants and decongestants. Reports he had yellow nasal drainage that has somewhat improved since last week. Has cough which resolves after blowing his nose. Pressure under eyes when blowing his nose. He has a gargling feeling when he tries to exhale all of the air out of his lungs, but not when normally breathing. He reports a temperature a couple of weeks ago, measured 100F. Took Zyrtec (5 days), Dayquil, Nyquil, benadryl.     No Known Allergies   Current Outpatient Medications:  .  neomycin-polymyxin-hydrocortisone (CORTISPORIN) OTIC solution, Place 4 drops into the right ear 4 (four) times daily., Disp: 10 mL, Rfl: 0  Review of Systems  HENT: Positive for congestion and sinus pressure.   Respiratory: Positive for cough and wheezing.     Social History    Tobacco Use  . Smoking status: Never Smoker  . Smokeless tobacco: Never Used  Substance Use Topics  . Alcohol use: No      Objective:   There were no vitals taken for this visit. There were no vitals filed for this visit.There is no height or weight on file to calculate BMI.   Physical Exam   No results found for any visits on 03/28/19.     Assessment & Plan    1. Allergic rhinitis, unspecified seasonality, unspecified trigger  Suspect underlying allergies. Would recommend he take xyzal daily and flonase 2 sprays each nostril daily. If not working after 7-10 days he may start the augmentin.   2. Sinus congestion  - amoxicillin-clavulanate (AUGMENTIN) 875-125 MG tablet; Take 1 tablet by mouth 2 (two) times daily for 7 days.  Dispense: 14 tablet; Refill: 0    The patient was advised to call back or seek an in-person evaluation if the symptoms worsen or if the condition fails to improve as anticipated.  I provided 15 minutes of non-face-to-face time during this encounter.  The entirety of the information documented in the History of Present Illness, Review of Systems and Physical Exam were personally obtained by me. Portions of this information were initially documented by Lynford Humphrey, CMA and reviewed by me for thoroughness and accuracy.   F/u PRN    Trinna Post, PA-C  [facility]

## 2019-03-28 NOTE — Patient Instructions (Signed)
Allergies, Adult An allergy means that your body reacts to something that bothers it (allergen). It is not a normal reaction. This can happen from something that you:  Eat.  Breathe in.  Touch. You can have an allergy (be allergic) to:  Outdoor things, like: ? Pollen. ? Grass. ? Weeds.  Indoor things, like: ? Dust. ? Smoke. ? Pet dander.  Foods.  Medicines.  Things that bother your skin, like: ? Detergents. ? Chemicals. ? Latex.  Perfume.  Bugs. An allergy cannot spread from person to person (is not contagious). Follow these instructions at home:         Stay away from things that you know you are allergic to.  If you have allergies to things in the air, wash out your nose each day. Do it with one of these: ? A salt-water (saline) spray. ? A container (neti pot).  Take over-the-counter and prescription medicines only as told by your doctor.  Keep all follow-up visits as told by your doctor. This is important.  If you are at risk for a very bad allergy reaction (anaphylaxis), keep an auto-injector with you all the time. This is called an epinephrine injection. ? This is pre-measured medicine with a needle. You can put it into your skin by yourself. ? Right after you have a very bad allergy reaction, you or a person with you must give the medicine in less than a few minutes. This is an emergency.  If you have ever had a very bad allergy reaction, wear a medical alert bracelet or necklace. Your very bad allergy should be written on it. Contact a health care provider if:  Your symptoms do not get better with treatment. Get help right away if:  You have symptoms of a very bad allergy reaction. These include: ? A swollen mouth, tongue, or throat. ? Pain or tightness in your chest. ? Trouble breathing. ? Being short of breath. ? Dizziness. ? Fainting. ? Very bad pain in your belly (abdomen). ? Throwing up (vomiting). ? Watery poop (diarrhea). Summary   An allergy means that your body reacts to something that bothers it (allergen). It is not a normal reaction.  Stay away from things that make your body react.  Take over-the-counter and prescription medicines only as told by your doctor.  If you are at risk for a very bad allergy reaction, carry an auto-injector (epinephrine injection) all the time. Also, wear a medical alert bracelet or necklace so people know about your allergy. This information is not intended to replace advice given to you by your health care provider. Make sure you discuss any questions you have with your health care provider. Document Released: 10/03/2012 Document Revised: 09/27/2018 Document Reviewed: 09/21/2016 Elsevier Patient Education  2020 Elsevier Inc.  

## 2019-03-30 ENCOUNTER — Telehealth: Payer: Self-pay | Admitting: Physician Assistant

## 2019-03-30 DIAGNOSIS — R0981 Nasal congestion: Secondary | ICD-10-CM

## 2019-03-30 MED ORDER — AMOXICILLIN 400 MG/5ML PO SUSR: 875.0000 mg | Freq: Two times a day (BID) | ORAL | 0 refills | Status: AC

## 2019-03-30 NOTE — Telephone Encounter (Signed)
Sent in liquid amoxicillin to [facility].

## 2019-03-30 NOTE — Telephone Encounter (Signed)
Ana advised.

## 2019-03-30 NOTE — Telephone Encounter (Signed)
Javier Ferguson,Javier Ferguson (EC) [phone] (H)   Javier Ferguson,Javier Ferguson - Calling for husband/pt.  He forgot to let Fabio Bering know at his visit he is needing his antibiotic liquid form sent to:  [place] [address] [place] [phone] (Phone) [phone] (Fax)   Thanks, [facility]

## 2019-08-28 ENCOUNTER — Encounter: Payer: Self-pay | Admitting: Physician Assistant

## 2019-10-04 IMAGING — CT CT HEAD W/O CM
3 series · 16 of 47 positions shown, 19 images · non-contrast
Comparison: None.

CLINICAL DATA: Syncopal episode in bathroom. Sore throat and
malaise today.

EXAM:
CT HEAD WITHOUT CONTRAST
TECHNIQUE: Contiguous axial images were obtained from the base of the skull
through the vertex without intravenous contrast.

[Series 3: head wo · axial · 0.45mm/px · z∈[-67,+63]mm · 10 of 32 slices shown, 13 images]
[im 3/32  brain]
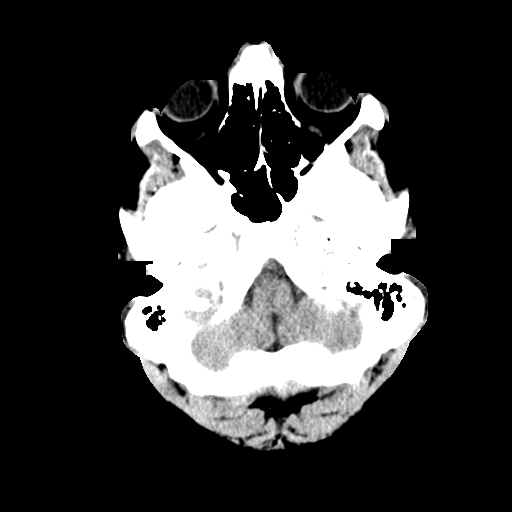
[im 3/32  bone]
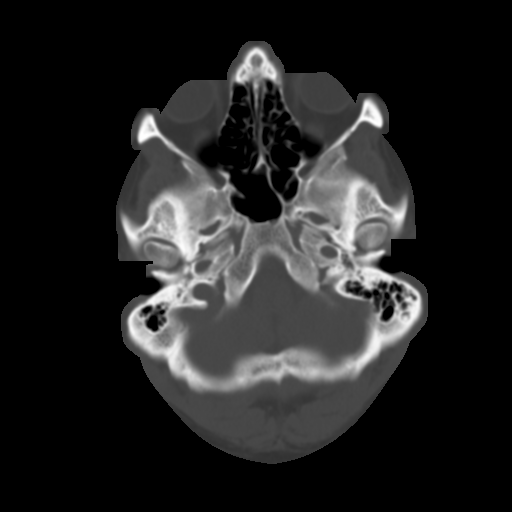
[im 6/32  brain]
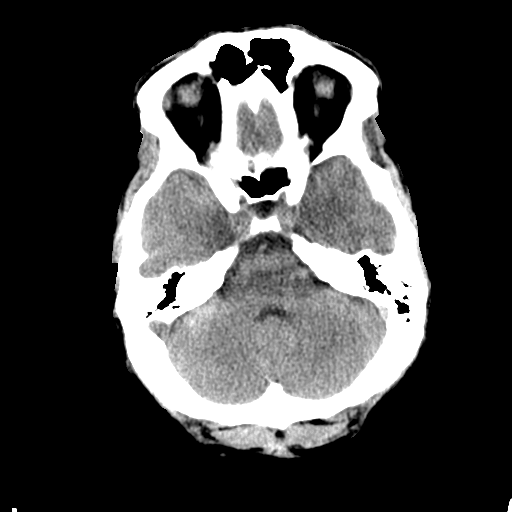
[im 9/32  brain]
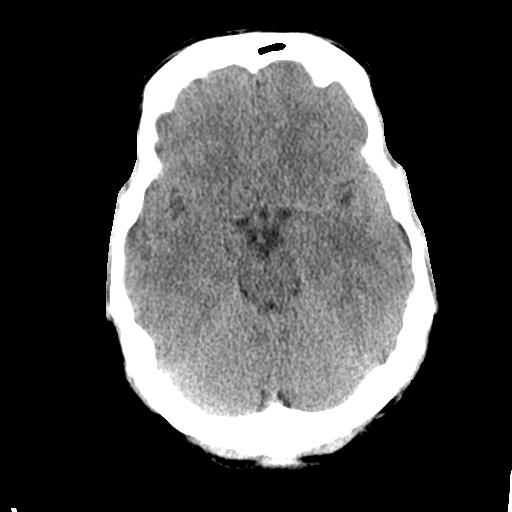
[im 11/32  brain]
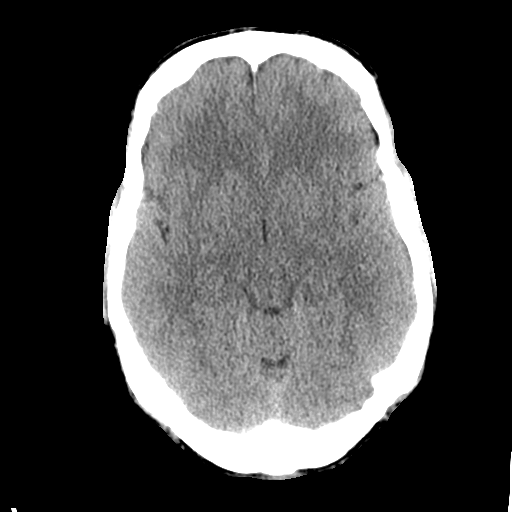
[im 14/32  brain]
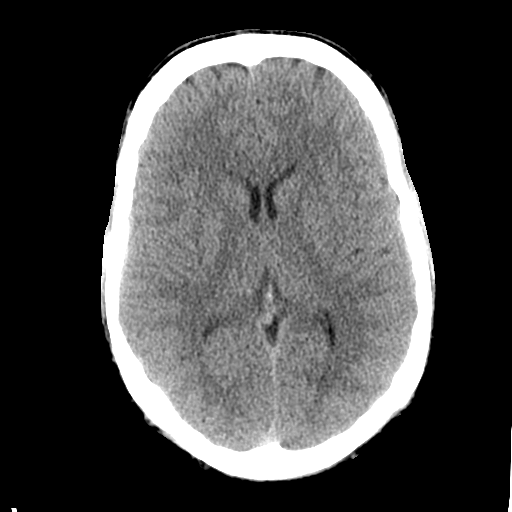
[im 14/32  bone]
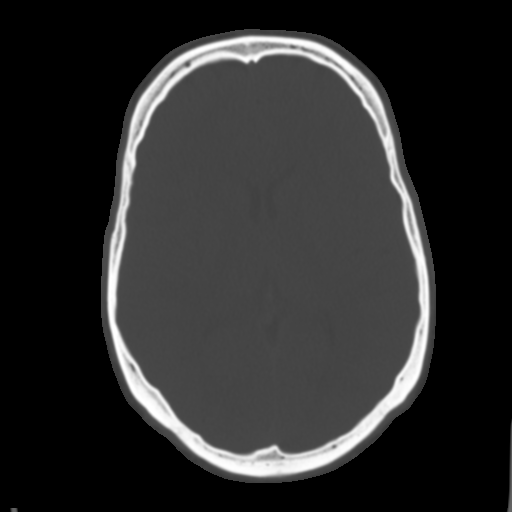
[im 18/32  brain]
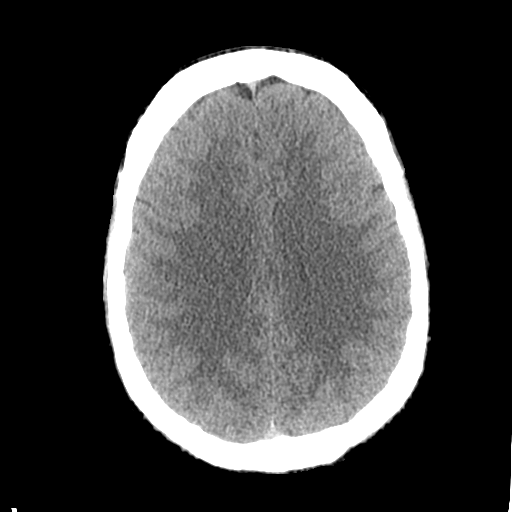
[im 21/32  brain]
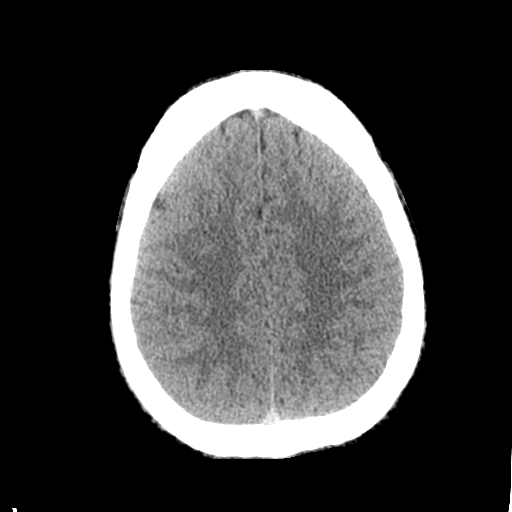
[im 24/32  brain]
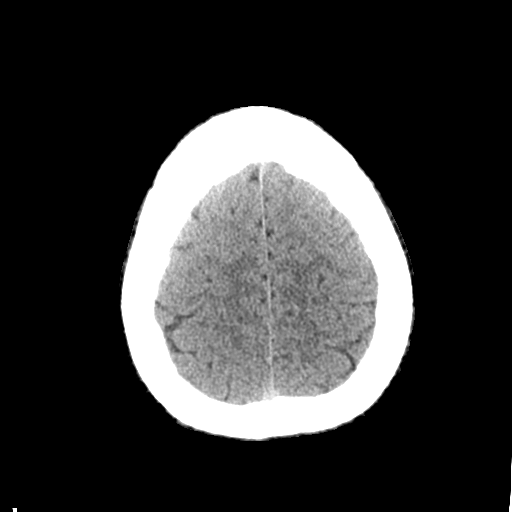
[im 26/32  brain]
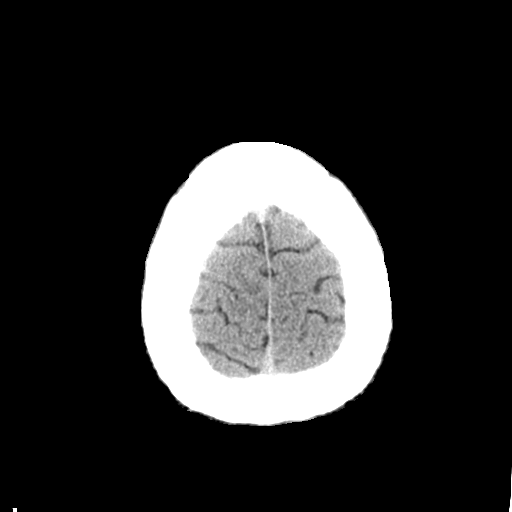
[im 26/32  bone]
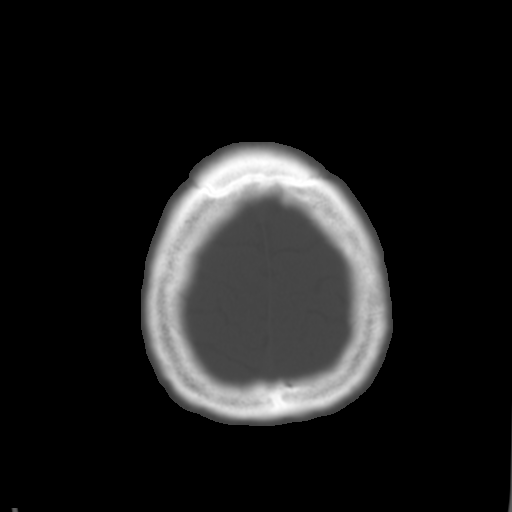
[im 29/32  brain]
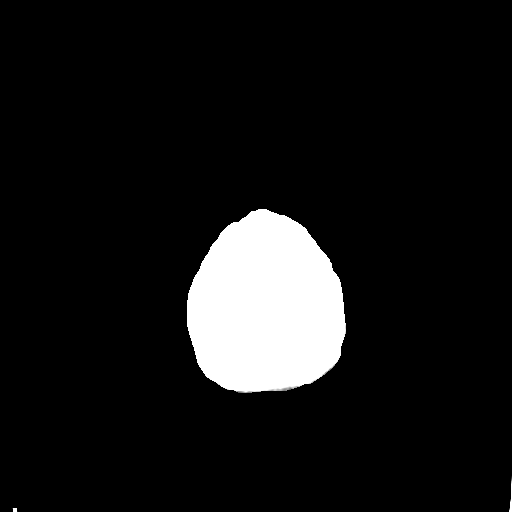

[Series 4: coronal soft tissue · coronal · 0.31mm/px · 3 of 72 slices shown]
[im 24/72  brain]
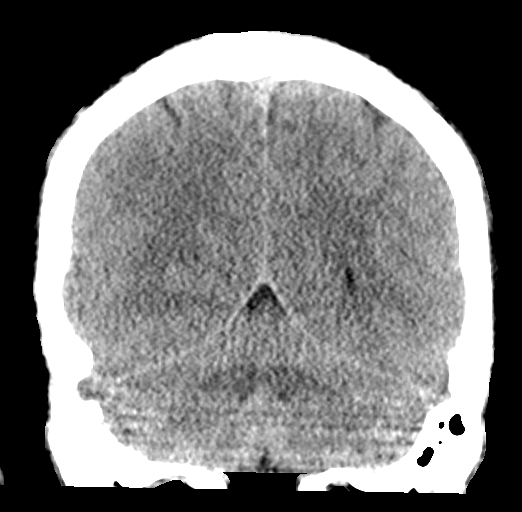
[im 32/72  brain]
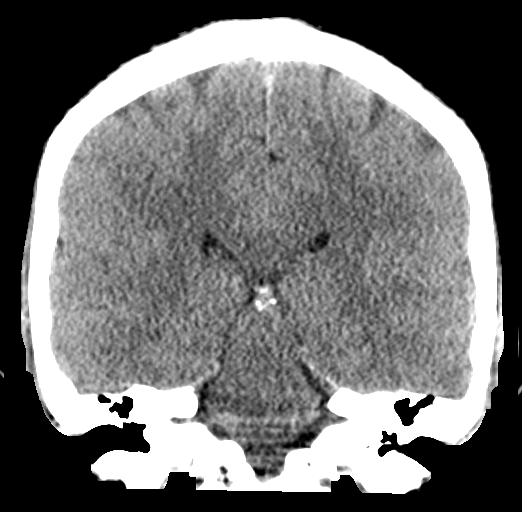
[im 40/72  brain]
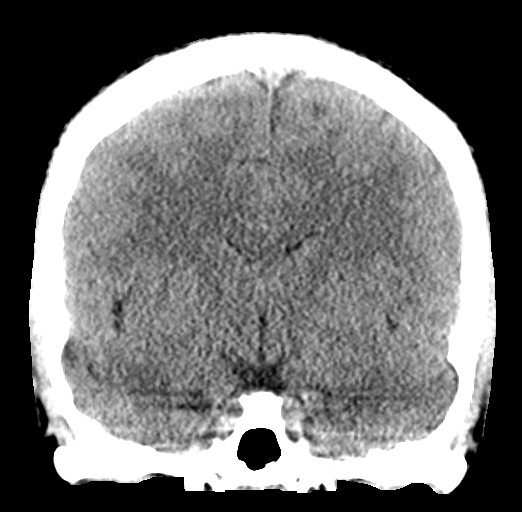

[Series 6: sagittal soft tissue- · sagittal · 0.31mm/px · 3 of 55 slices shown]
[im 19/55  brain]
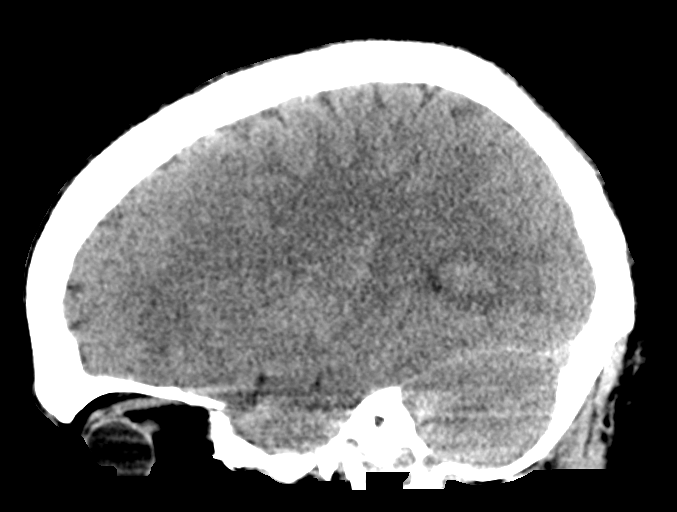
[im 28/55  brain]
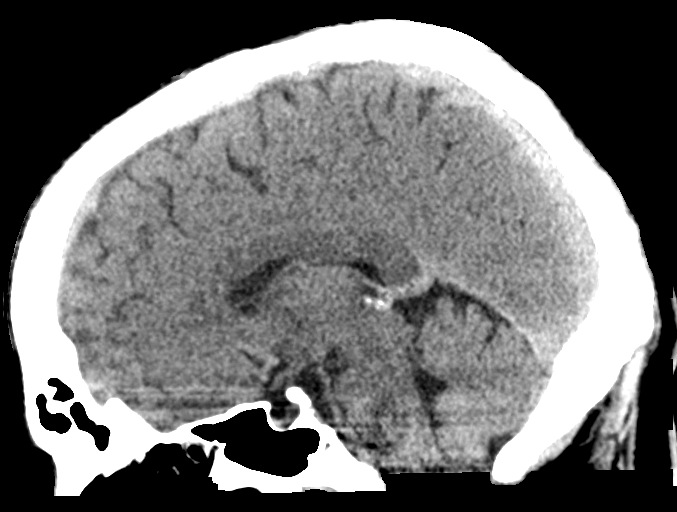
[im 37/55  brain]
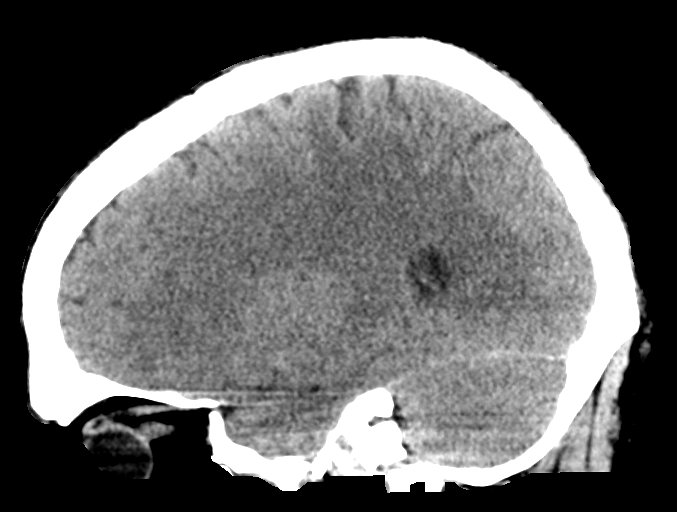

[16 of 47 positions shown; findings below may reference images not displayed]

FINDINGS: BRAIN: No intraparenchymal hemorrhage, mass effect nor midline
shift. The ventricles and sulci are normal. No acute large vascular
territory infarcts. No abnormal extra-axial fluid collections. Basal
cisterns are patent.

VASCULAR: Unremarkable.

SKULL/SOFT TISSUES: No skull fracture. No significant soft tissue
swelling.

ORBITS/SINUSES: The included ocular globes and orbital contents are
normal.The mastoid aircells and included paranasal sinuses are
well-aerated.

OTHER: None.
IMPRESSION: Normal noncontrast CT HEAD.

## 2020-03-19 DIAGNOSIS — Z135 Encounter for screening for eye and ear disorders: Secondary | ICD-10-CM | POA: Diagnosis not present

## 2020-03-19 DIAGNOSIS — H52223 Regular astigmatism, bilateral: Secondary | ICD-10-CM | POA: Diagnosis not present

## 2020-06-21 DIAGNOSIS — Z03818 Encounter for observation for suspected exposure to other biological agents ruled out: Secondary | ICD-10-CM | POA: Diagnosis not present

## 2020-06-21 DIAGNOSIS — U071 COVID-19: Secondary | ICD-10-CM | POA: Diagnosis not present

## 2020-06-21 DIAGNOSIS — Z20822 Contact with and (suspected) exposure to covid-19: Secondary | ICD-10-CM | POA: Diagnosis not present

## 2020-06-23 ENCOUNTER — Ambulatory Visit: Admit: 2020-06-23 | Disposition: A | Discharge status: Home / Self Care | Payer: BC Managed Care – PPO
